# Patient Record
Sex: Female | Born: 1980 | Race: White | Hispanic: No | Marital: Married | Smoking: Never smoker
Health system: Southern US, Community
[De-identification: ages and names within clinical notes are randomized; demographics above are authoritative.]

---

## 2016-04-01 ENCOUNTER — Emergency Department (HOSPITAL_COMMUNITY)
Admission: EM | Admit: 2016-04-01 | Discharge: 2016-04-01 | Disposition: A | Discharge status: Home / Self Care | Payer: 59 | Attending: Emergency Medicine | Admitting: Emergency Medicine

## 2016-04-01 ENCOUNTER — Emergency Department (HOSPITAL_COMMUNITY): Payer: 59

## 2016-04-01 ENCOUNTER — Encounter (HOSPITAL_COMMUNITY): Payer: Self-pay | Admitting: Emergency Medicine

## 2016-04-01 DIAGNOSIS — Z791 Long term (current) use of non-steroidal anti-inflammatories (NSAID): Secondary | ICD-10-CM | POA: Insufficient documentation

## 2016-04-01 DIAGNOSIS — M791 Myalgia: Secondary | ICD-10-CM | POA: Diagnosis not present

## 2016-04-01 DIAGNOSIS — R51 Headache: Secondary | ICD-10-CM | POA: Diagnosis present

## 2016-04-01 DIAGNOSIS — G8929 Other chronic pain: Secondary | ICD-10-CM | POA: Diagnosis not present

## 2016-04-01 MED ORDER — NAPROXEN 500 MG PO TABS: 500.0000 mg | ORAL_TABLET | Freq: Two times a day (BID) | ORAL | 0 refills | Status: AC

## 2016-04-01 MED ORDER — FLUTICASONE PROPIONATE 50 MCG/ACT NA SUSP: 1.0000 | Freq: Every day | NASAL | 0 refills | Status: AC

## 2016-04-01 NOTE — ED Triage Notes (Signed)
Pt reports left posterior headache ongoing since February. Pt reports headache associated with hx of staph infection behind left ear. Pt reports primary care reports pain secondary to infection and diagnosed with muscle strain. Pt here for second opinion because she is visiting family here; pt is here from [place] and states care here is different.

## 2016-04-01 NOTE — ED Provider Notes (Signed)
[facility]-EMERGENCY DEPT Provider Note   CSN: [id number] Arrival date & time: 04/01/16  4098  First Provider Contact:  First MD Initiated Contact with Patient 04/01/16 1110        History   Chief Complaint Chief Complaint  Patient presents with  . Migraine    HPI Audrey Harvey is a 35 y.o. female.  Patient presents with chronic left sided "pulling" headache since February.  Patient reports she had a staph infection behind her left ear that was treated with abx and abx cream x 10 days.  This resolved; however, since then she has been experiencing pain at that sight she describes as pulling.  She has seen her PCP and PT without relief.  She reports taking ibuprofen, tylenol, and muscle relaxers with no relief.  No aggravating factors.  Not worse with movement or palpation.  Denies fever, chills, visual disturbance, hearing changes, ear discharge, neck pain/stiffness, numbness, weakness, erythema, drainage.   The history is provided by the patient.  Migraine  This is a chronic problem. The current episode started more than 1 month ago. The problem occurs constantly. The problem has been unchanged. Associated symptoms include headaches and myalgias. Pertinent negatives include no abdominal pain, chest pain, chills, congestion, coughing, fever, neck pain, numbness, vertigo, visual change or weakness. Nothing aggravates the symptoms. She has tried acetaminophen and NSAIDs for the symptoms. The treatment provided no relief.    History reviewed. No pertinent past medical history.  There are no active problems to display for this patient.   History reviewed. No pertinent surgical history.  OB History    No data available       Home Medications    Prior to Admission medications   Medication Sig Start Date End Date Taking? Authorizing Provider  ibuprofen (ADVIL,MOTRIN) 200 MG tablet Take 400 mg by mouth every 6 (six) hours as needed for headache, mild pain or moderate pain.   Yes  Historical Provider, MD  fluticasone (FLONASE) 50 MCG/ACT nasal spray Place 1 spray into both nostrils daily. 04/01/16   Cheri Fowler, PA-C  naproxen (NAPROSYN) 500 MG tablet Take 1 tablet (500 mg total) by mouth 2 (two) times daily. 04/01/16   Cheri Fowler, PA-C    Family History No family history on file.  Social History Social History  Substance Use Topics  . Smoking status: Never Smoker  . Smokeless tobacco: Not on file  . Alcohol use No     Allergies   Review of patient's allergies indicates no known allergies.   Review of Systems Review of Systems  Constitutional: Negative for chills and fever.  HENT: Positive for sinus pressure. Negative for congestion, ear discharge and ear pain.   Eyes: Negative for visual disturbance.  Respiratory: Negative for cough.   Cardiovascular: Negative for chest pain.  Gastrointestinal: Negative for abdominal pain.  Musculoskeletal: Positive for myalgias. Negative for neck pain.  Neurological: Positive for headaches. Negative for vertigo, weakness and numbness.     Physical Exam Updated Vital Signs BP 126/91 (BP Location: Right Arm)   Pulse 91   Temp 98.3 F (36.8 C) (Oral)   Resp 16   LMP 03/11/2016   SpO2 99%   Physical Exam  Constitutional: She is oriented to person, place, and time. She appears well-developed and well-nourished.  Non-toxic appearance. She does not have a sickly appearance. She does not appear ill.  HENT:  Head: Normocephalic and atraumatic.    Right Ear: Tympanic membrane and external ear normal. No drainage, swelling or  tenderness.  Left Ear: Tympanic membrane and external ear normal. No drainage, swelling or tenderness.  Mouth/Throat: Oropharynx is clear and moist.  Eyes: Conjunctivae are normal. Pupils are equal, round, and reactive to light.  Neck: Normal range of motion. Neck supple.  No cervical or paracervical midline tenderness. FAROM of cervical spine.   Cardiovascular: Normal rate and regular rhythm.    Pulmonary/Chest: Effort normal and breath sounds normal. No accessory muscle usage or stridor. No respiratory distress. She has no wheezes. She has no rhonchi. She has no rales.  Abdominal: Soft. Bowel sounds are normal. She exhibits no distension and no mass. There is no tenderness. There is no guarding.  Musculoskeletal: Normal range of motion.  Lymphadenopathy:    She has no cervical adenopathy.  Neurological: She is alert and oriented to person, place, and time.  Speech clear without dysarthria.  Skin: Skin is warm and dry.  Psychiatric: She has a normal mood and affect. Her behavior is normal.     ED Treatments / Results  Labs (all labs ordered are listed, but only abnormal results are displayed) Labs Reviewed - No data to display  EKG  EKG Interpretation None       Radiology Ct Head Wo Contrast  Result Date: 04/01/2016 CLINICAL DATA:  Posterior headache for several months. EXAM: CT HEAD WITHOUT CONTRAST TECHNIQUE: Contiguous axial images were obtained from the base of the skull through the vertex without intravenous contrast. COMPARISON:  None. FINDINGS: Brain: No evidence of acute infarction, hemorrhage, extra-axial collection, ventriculomegaly, or mass effect. Vascular: No hyperdense vessel or unexpected calcification. Skull: Negative for fracture or focal lesion. Sinuses/Orbits: No acute findings. Other: None. IMPRESSION: Normal.  No cause for headache identified. Electronically Signed   By: Gerome Sam III M.D   On: 04/01/2016 12:10   Procedures Procedures (including critical care time)  Medications Ordered in ED Medications - No data to display   Initial Impression / Assessment and Plan / ED Course  I have reviewed the triage vital signs and the nursing notes.  Pertinent labs & imaging results that were available during my care of the patient were reviewed by me and considered in my medical decision making (see chart for details).  Clinical Course    Patient presents with left sided headache x 5 months.  No systemic symptoms.  FAROM of cervical spine.  Non-tender.  HENT exam benign.  No mastoid tenderness.  Patient appears well, non-toxic or septic.  Possible mastoiditis.  Will obtain CT head for further evaluation.    CT head normal.  Plan to discharge home with [facility] and ENT follow up.  Return precautions discussed.  Patient agrees and acknowledges the above plan for discharge.  Case has been discussed with Dr. Effie Shy who agrees with the above plan for discharge.     Final Clinical Impressions(s) / ED Diagnoses   Final diagnoses:  Chronic nonintractable headache, unspecified headache type    New Prescriptions New Prescriptions   FLUTICASONE (FLONASE) 50 MCG/ACT NASAL SPRAY    Place 1 spray into both nostrils daily.   NAPROXEN (NAPROSYN) 500 MG TABLET    Take 1 tablet (500 mg total) by mouth 2 (two) times daily.     Cheri Fowler, PA-C 04/01/16 1320    Mancel Bale, MD 04/02/16 [phone]

## 2016-04-01 NOTE — Discharge Instructions (Signed)
Your head CT today was normal.  Start taking FLonase daily and using Naprosyn twice daily.  Follow up with ENT.

## 2017-06-08 IMAGING — CT CT HEAD W/O CM
3 of 4 series · 17 of 47 positions shown, 20 images · non-contrast
Comparison: None.

CLINICAL DATA: Posterior headache for several months.

EXAM:
CT HEAD WITHOUT CONTRAST
TECHNIQUE: Contiguous axial images were obtained from the base of the skull
through the vertex without intravenous contrast.

[Series 2: head w/o · axial · non-contrast · 0.46mm/px · z∈[-83,+37]mm · 11 of 28 slices shown, 14 images]
[im 2/28  brain]
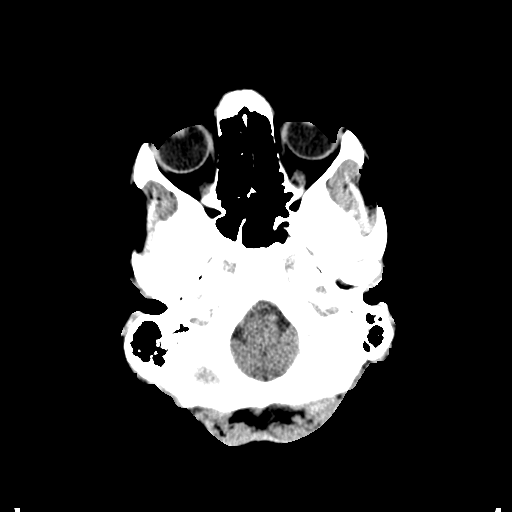
[im 2/28  bone]
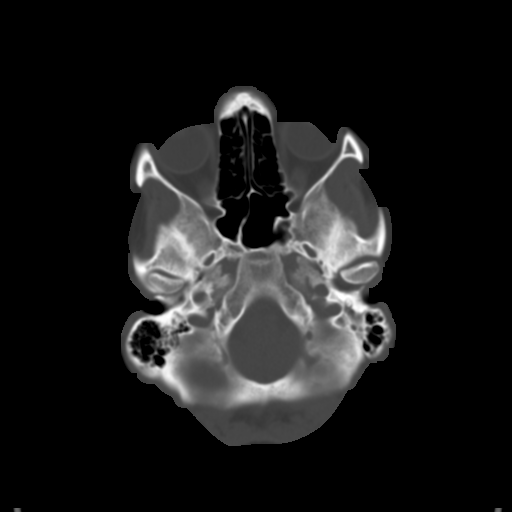
[im 4/28  brain]
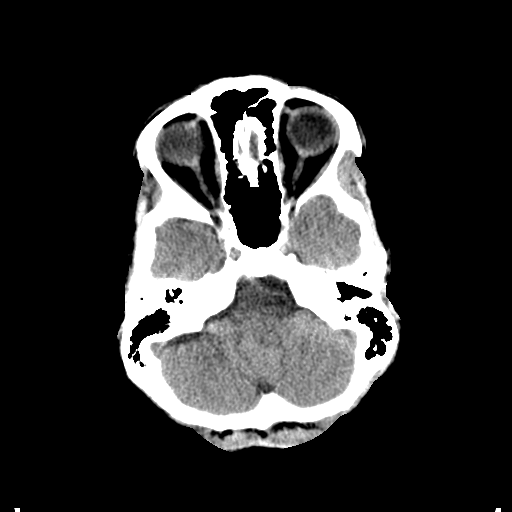
[im 6/28  brain]
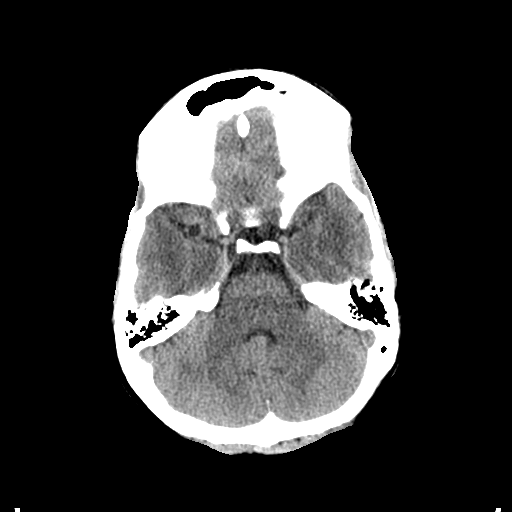
[im 10/28  brain]
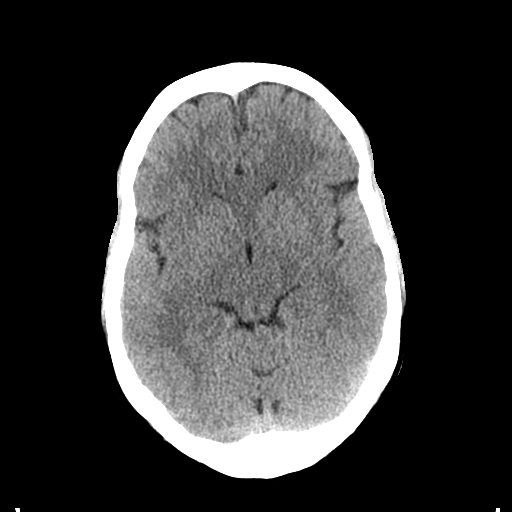
[im 12/28  brain]
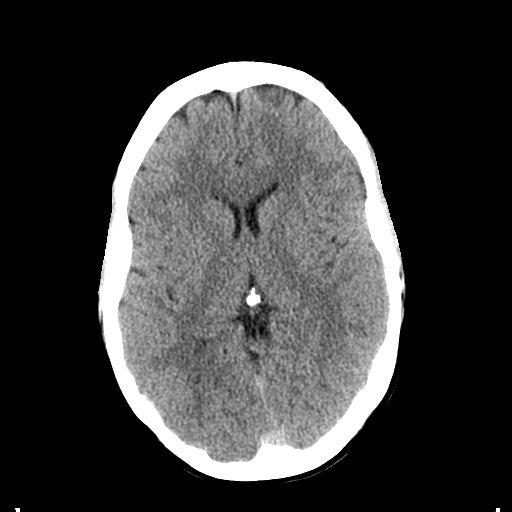
[im 12/28  bone]
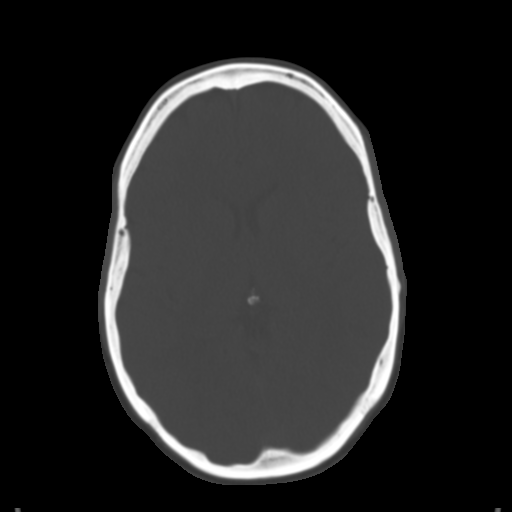
[im 14/28  brain]
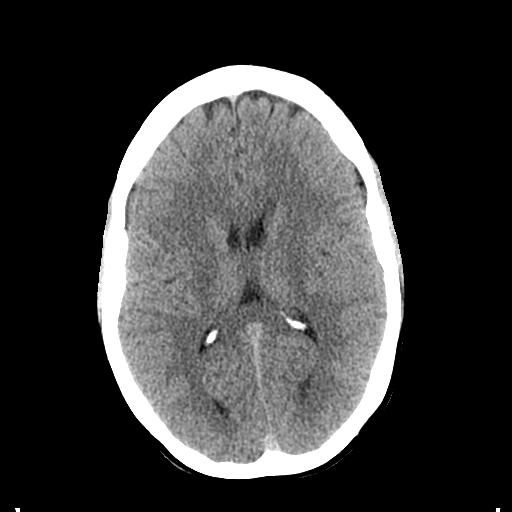
[im 16/28  brain]
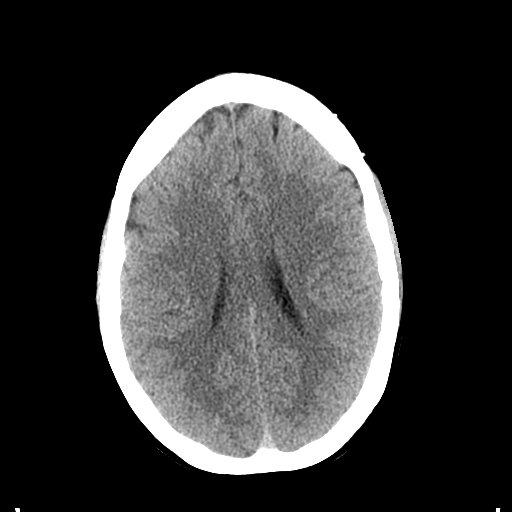
[im 18/28  brain]
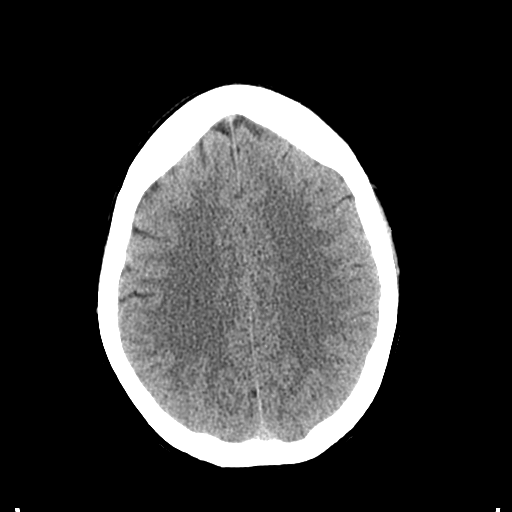
[im 22/28  brain]
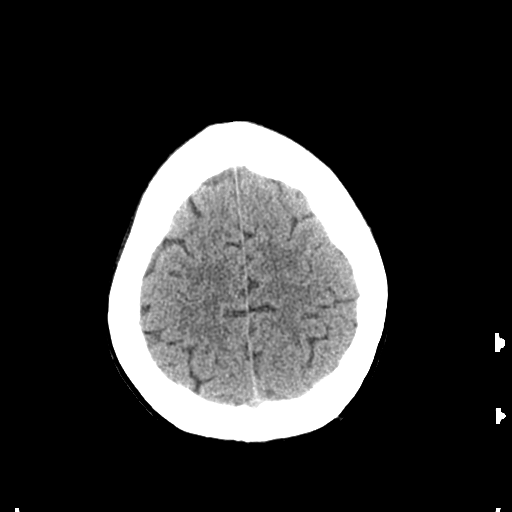
[im 22/28  bone]
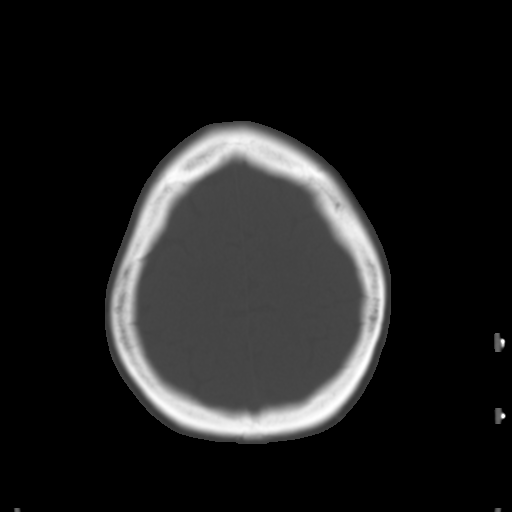
[im 24/28  brain]
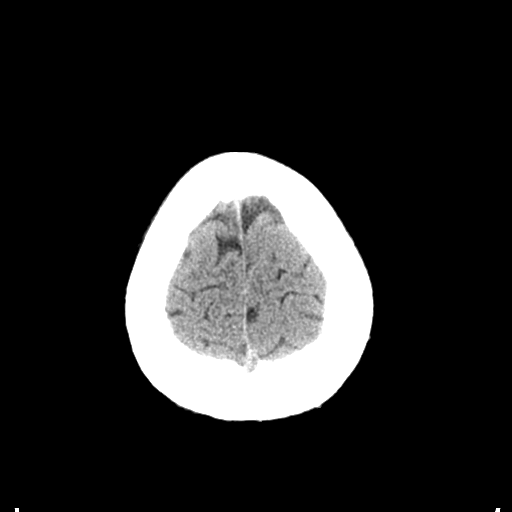
[im 26/28  brain]
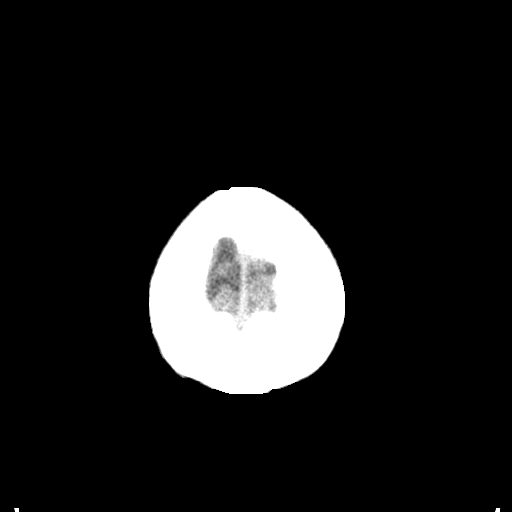

[Series 602: <mpr thick range> · coronal · 0.46mm/px · 3 of 67 slices shown]
[im 23/67  brain]
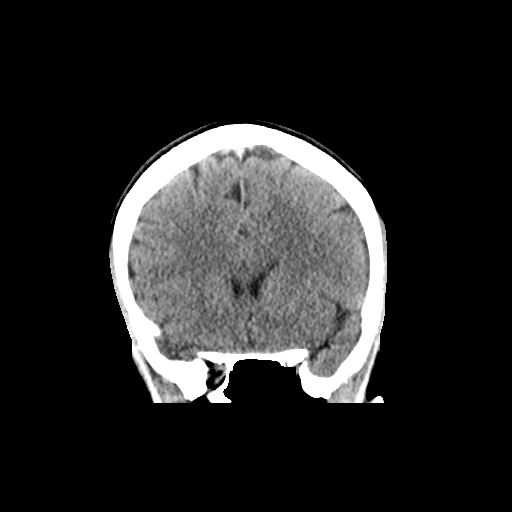
[im 30/67  brain]
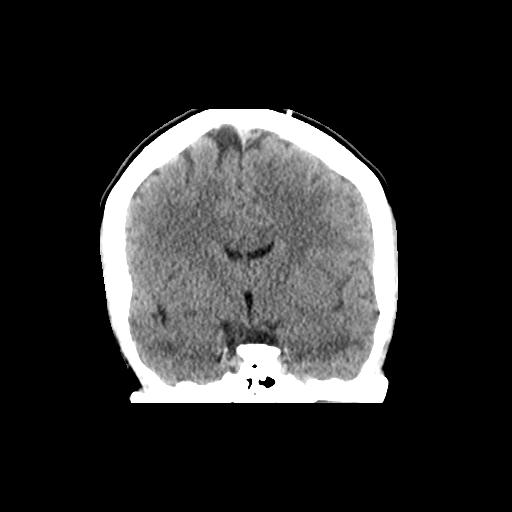
[im 37/67  brain]
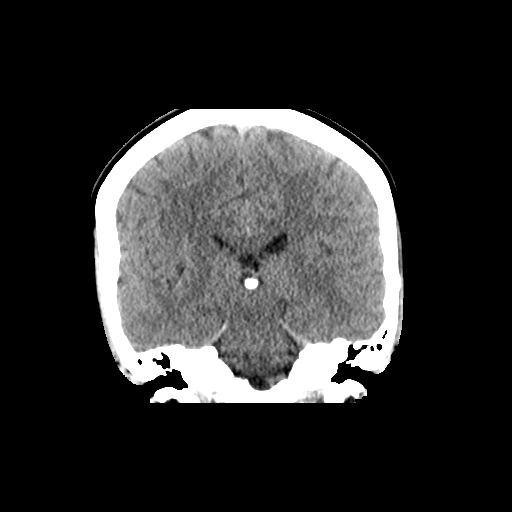

[Series 603: <mpr thick range(1)> · sagittal · 0.46mm/px · 3 of 54 slices shown]
[im 18/54  brain]
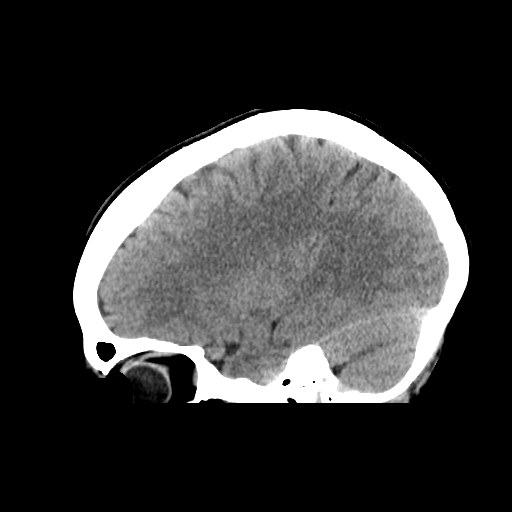
[im 27/54  brain]
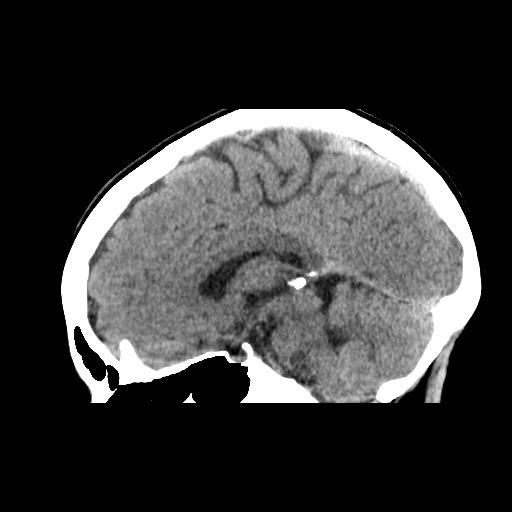
[im 36/54  brain]
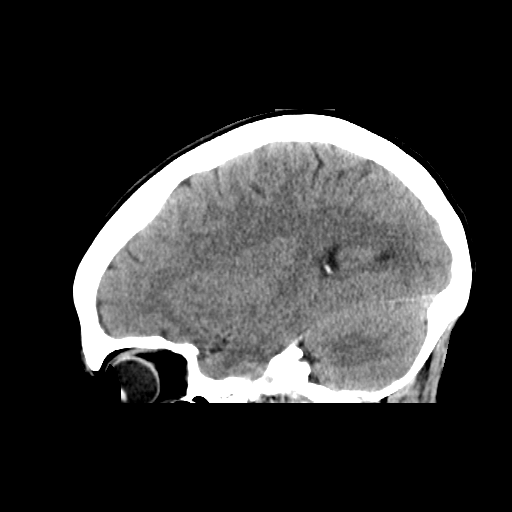

[17 of 47 positions shown; findings below may reference images not displayed]

FINDINGS: Brain: No evidence of acute infarction, hemorrhage, extra-axial
collection, ventriculomegaly, or mass effect.

Vascular: No hyperdense vessel or unexpected calcification.

Skull: Negative for fracture or focal lesion.

Sinuses/Orbits: No acute findings.

Other: None.
IMPRESSION: Normal.  No cause for headache identified.
